# Patient Record
Sex: Female | Born: 1940 | Race: White | Hispanic: No
Health system: Southern US, Community
[De-identification: ages and names within clinical notes are randomized; demographics above are authoritative.]

---

## 2021-10-17 ENCOUNTER — Other Ambulatory Visit (HOSPITAL_BASED_OUTPATIENT_CLINIC_OR_DEPARTMENT_OTHER): Payer: Self-pay

## 2021-10-18 ENCOUNTER — Other Ambulatory Visit (HOSPITAL_BASED_OUTPATIENT_CLINIC_OR_DEPARTMENT_OTHER): Payer: Self-pay

## 2021-10-18 MED ORDER — LEVOTHYROXINE SODIUM 75 MCG PO TABS
ORAL_TABLET | ORAL | 1 refills | Status: DC
Start: 2021-10-18 — End: 2021-12-26
  Filled 2021-10-18: qty 30, 30d supply, fill #0
  Filled 2021-11-25: qty 30, 30d supply, fill #1

## 2021-10-18 MED ORDER — SERTRALINE HCL 100 MG PO TABS
ORAL_TABLET | ORAL | 1 refills | Status: AC
  Filled 2021-10-18: qty 30, 30d supply, fill #0
  Filled 2021-11-25: qty 30, 30d supply, fill #1

## 2021-10-30 ENCOUNTER — Other Ambulatory Visit (HOSPITAL_BASED_OUTPATIENT_CLINIC_OR_DEPARTMENT_OTHER): Payer: Self-pay

## 2021-10-30 MED ORDER — FAMOTIDINE 40 MG PO TABS
ORAL_TABLET | ORAL | 2 refills | Status: AC
  Filled 2021-10-30: qty 55, 55d supply, fill #0
  Filled 2021-10-31: qty 35, 35d supply, fill #0
  Filled 2022-01-22: qty 90, 90d supply, fill #1

## 2021-10-31 ENCOUNTER — Other Ambulatory Visit: Payer: Self-pay

## 2021-10-31 ENCOUNTER — Other Ambulatory Visit (HOSPITAL_BASED_OUTPATIENT_CLINIC_OR_DEPARTMENT_OTHER): Payer: Self-pay

## 2021-10-31 DIAGNOSIS — Z720 Tobacco use: Secondary | ICD-10-CM

## 2021-11-25 ENCOUNTER — Other Ambulatory Visit (HOSPITAL_BASED_OUTPATIENT_CLINIC_OR_DEPARTMENT_OTHER): Payer: Self-pay

## 2021-11-25 ENCOUNTER — Other Ambulatory Visit (HOSPITAL_COMMUNITY): Payer: Self-pay

## 2021-11-26 ENCOUNTER — Other Ambulatory Visit (HOSPITAL_BASED_OUTPATIENT_CLINIC_OR_DEPARTMENT_OTHER): Payer: Self-pay

## 2021-11-28 ENCOUNTER — Ambulatory Visit
Admission: RE | Admit: 2021-11-28 | Discharge: 2021-11-28 | Disposition: A | Discharge status: Home / Self Care | Payer: Medicare Other | Source: Ambulatory Visit

## 2021-11-28 ENCOUNTER — Other Ambulatory Visit: Payer: Self-pay

## 2021-11-28 DIAGNOSIS — Z720 Tobacco use: Secondary | ICD-10-CM

## 2021-12-03 ENCOUNTER — Other Ambulatory Visit: Payer: Self-pay | Admitting: *Deleted

## 2021-12-03 ENCOUNTER — Inpatient Hospital Stay: Payer: PPO

## 2021-12-03 ENCOUNTER — Inpatient Hospital Stay: Payer: PPO | Attending: Oncology

## 2021-12-03 DIAGNOSIS — D709 Neutropenia, unspecified: Secondary | ICD-10-CM

## 2021-12-03 DIAGNOSIS — E039 Hypothyroidism, unspecified: Secondary | ICD-10-CM | POA: Insufficient documentation

## 2021-12-03 DIAGNOSIS — D72819 Decreased white blood cell count, unspecified: Secondary | ICD-10-CM | POA: Diagnosis not present

## 2021-12-03 DIAGNOSIS — D61818 Other pancytopenia: Secondary | ICD-10-CM | POA: Diagnosis not present

## 2021-12-03 DIAGNOSIS — D696 Thrombocytopenia, unspecified: Secondary | ICD-10-CM | POA: Diagnosis not present

## 2021-12-03 LAB — SAVE SMEAR(SSMR), FOR PROVIDER SLIDE REVIEW

## 2021-12-03 LAB — CBC WITH DIFFERENTIAL (CANCER CENTER ONLY)
Abs Immature Granulocytes: 0.01 10*3/uL (ref 0.00–0.07)
Basophils Absolute: 0 10*3/uL (ref 0.0–0.1)
Basophils Relative: 0 %
Eosinophils Absolute: 0.1 10*3/uL (ref 0.0–0.5)
Eosinophils Relative: 3 %
HCT: 40 % (ref 36.0–46.0)
Hemoglobin: 13.2 g/dL (ref 12.0–15.0)
Immature Granulocytes: 0 %
Lymphocytes Relative: 32 %
Lymphs Abs: 1.1 10*3/uL (ref 0.7–4.0)
MCH: 33 pg (ref 26.0–34.0)
MCHC: 33 g/dL (ref 30.0–36.0)
MCV: 100 fL (ref 80.0–100.0)
Monocytes Absolute: 0.2 10*3/uL (ref 0.1–1.0)
Monocytes Relative: 6 %
Neutro Abs: 2 10*3/uL (ref 1.7–7.7)
Neutrophils Relative %: 59 %
Platelet Count: 97 10*3/uL — ABNORMAL LOW (ref 150–400)
RBC: 4 MIL/uL (ref 3.87–5.11)
RDW: 13.9 % (ref 11.5–15.5)
WBC Count: 3.4 10*3/uL — ABNORMAL LOW (ref 4.0–10.5)
nRBC: 0 % (ref 0.0–0.2)

## 2021-12-03 NOTE — Progress Notes (Signed)
New Hematology/Oncology Consult ? ? ?Requesting MD: Linus Galas, NP ? ?[phone] ? ?   ? ?Reason for Consult: Pancytopenia ? ?HPI: Brandy Rosario is an 81 year old woman referred for evaluation of pancytopenia.  She had an annual wellness visit on 10/30/2021.  Labs from 10/17/2021 showed hemoglobin 13.2, white blood cell count 2.7, ANC 1.5, platelet count 95,000; unremarkable chemistry panel.  CBC done 08/01/2021: Hemoglobin 13.9, white blood cell count 3.9, ANC 2.3, platelet count 104,000.   ? ?She had a chest CT 11/28/2021 to evaluate increased shortness of breath, cough.  There were no acute findings.  Liver margin noted to be slightly irregular indicative of cirrhosis. ? ?She relocated to this area from [place] about 6 months ago.  She reports being told in the past platelets were low and "nothing to worry about". ? ?Past medical history: ?Hypothyroid ?Depression ?"Reflux" ? ?History reviewed. No pertinent surgical history.: ? ? ?Current Outpatient Medications:  ?  famotidine (PEPCID) 40 MG tablet, Take 1 tablet by mouth at bedtime, Disp: 90 tablet, Rfl: 2 ?  levothyroxine (SYNTHROID) 75 MCG tablet, Take 1 tablet by mouth once daily in the morning on an empty stomach., Disp: 30 tablet, Rfl: 1 ?  sertraline (ZOLOFT) 100 MG tablet, Take 1 tablet by mouth once daily., Disp: 30 tablet, Rfl: 1: ? ? ?Allergies:  No known drug allergies ? ?FH: Mother deceased with heart issues.  Father deceased with lung cancer.  Sister recently deceased, possibly pancreas cancer. ? ?SOCIAL HISTORY: She relocated to this area from [place] about 6 months ago after her husband died.  She was initially living with her son and daughter-in-law.  She recently moved into independent senior living.  She is retired.  Previous bookkeeping.  She estimates smoking for the past 50 to 60 years, currently less than 1 pack/day.  She denies alcohol use. ? ?Review of Systems: No fevers.  She has occasional sweats at nighttime.  No recurrent infections.   No bleeding.  She notes easy bruising over the forearms.  No recent weight loss.  She did lose her appetite after her husband's death in 08/18/21.  Appetite is now described as good.  She has an occasional headache.  No visual disturbance.  She has stable dyspnea on exertion.  Chronic cough.  No change in bowel habits.  No urinary symptoms.  No numbness or tingling in the hands or feet. ? ?Physical Exam: ? ?Blood pressure 138/73, pulse 78, temperature 98.1 ?F (36.7 ?C), temperature source Oral, resp. rate 18, height 5\' 1"  (1.549 m), weight 114 lb 3.2 oz (51.8 kg), SpO2 96 %. ? ?HEENT: No thrush or ulcers. ?Lungs: Distant breath sounds. ?Cardiac: Regular rate and rhythm.  2/6 systolic murmur. ?Abdomen: Abdomen is soft and nontender.  No hepatosplenomegaly. ?Vascular: Trace bilateral ankle edema. ?Lymph nodes: No palpable cervical, supraclavicular, axillary or inguinal lymph nodes. ?Neurologic: Alert and oriented.  Intermittent resting hand tremor bilaterally. ?Skin: Ecchymoses scattered over the dorsal aspect of both hands and forearms.  Brown discoloration at the lower legs. ?Musculoskeletal: Back is kyphotic. ? ?LABS: ? ? ?Recent Labs  ?  12/03/21 ?1408  ?WBC 3.4*  ?HGB 13.2  ?HCT 40.0  ?PLT 97*  ?Peripheral blood smear-White cell morphology is unremarkable; platelets appear mildly decreased, no platelet clumps; moderate ovalocytes, microcytes, micro spherocytes, rare teardrop, no nucleated red blood cells, polychromasia not increased. ? ?RADIOLOGY: ? ?CT CHEST WO CONTRAST ? ?Result Date: 12/02/2021 ?CLINICAL DATA:  Increased shortness of breath, cough, smoker. EXAM: CT CHEST WITHOUT CONTRAST  TECHNIQUE: Multidetector CT imaging of the chest was performed following the standard protocol without IV contrast. RADIATION DOSE REDUCTION: This exam was performed according to the departmental dose-optimization program which includes automated exposure control, adjustment of the mA and/or kV according to patient size  and/or use of iterative reconstruction technique. COMPARISON:  None. FINDINGS: Cardiovascular: Atherosclerotic calcification of the aorta, aortic valve and coronary arteries. Calcified ductus diverticulum off the inferior aortic arch. Heart is enlarged. No pericardial effusion. Mediastinum/Nodes: No pathologically enlarged mediastinal or axillary lymph nodes. Hilar regions are difficult to definitively evaluate without IV contrast. Esophagus is grossly unremarkable. Lungs/Pleura: Mild biapical pleuroparenchymal scarring. Smoking related respiratory bronchiolitis. Image quality is somewhat degraded by respiratory motion. Scattered subsegmental volume loss. No airspace consolidation or pleural fluid. Calcified left lower lobe granuloma. No pleural fluid. Airway is unremarkable. Upper Abdomen: Liver margin is slightly irregular. Visualized portions of the liver, adrenal glands, kidneys, spleen and stomach are otherwise unremarkable. Musculoskeletal: Degenerative changes in the spine. No worrisome lytic or sclerotic lesions. IMPRESSION: 1. No acute findings to explain the patient's clinical history. 2. Liver margin is slightly irregular, indicative of cirrhosis. 3. Aortic atherosclerosis (ICD10-I70.0). Coronary artery calcification. Electronically Signed   By: Leanna Battles M.D.   On: 12/02/2021 11:52   ? ?Assessment and Plan:  ? ?Mild leukopenia and mild thrombocytopenia ?Possible cirrhosis on CT chest 11/28/2021 ?Current tobacco use ?Hypothyroid ?Depression ?"Reflux" ? ?Brandy Rosario was referred for evaluation of mild leukopenia and mild thrombocytopenia.  The differential diagnosis includes cirrhosis, myelodysplasia, vitamin deficiency, lymphoproliferative disorder.  We are obtaining additional laboratory evaluation today.  We are requesting prior CBCs from her PCP in [place]. ? ?She was noted to possibly have cirrhosis on a CT scan from 11/28/2021.  We will defer further evaluation of this to her PCP. ? ?She will  return for a CBC and follow-up visit in approximately 3 months.  We are available to see her sooner if needed. ? ?Patient seen with Dr. Truett Perna. ? ?Lonna Cobb, NP ?12/04/2021, 12:12 PM  ? ?This was a shared visit with Lonna Cobb.  Brandy Rosario was interviewed and examined.  I reviewed the peripheral blood smear.  She is referred for evaluation of leukopenia and thrombocytopenia. ?The differential diagnosis includes pancytopenia related to cirrhosis, though she does not have an established diagnosis of cirrhosis.  There was evidence of cirrhosis on a CT last month. ? ?The differential diagnosis clues myelodysplasia and less likely a hematopoietic malignancy.  We will add additional hematologic testing to the labs ordered yesterday. ? ?I was present for greater than 50% today's visit.  I performed medical decision making. ? ?Mancel Bale, MD ?

## 2021-12-03 NOTE — Progress Notes (Signed)
Lab orders entered for New patient appt 

## 2021-12-04 ENCOUNTER — Inpatient Hospital Stay: Payer: PPO | Admitting: Nurse Practitioner

## 2021-12-04 ENCOUNTER — Encounter: Payer: Self-pay | Admitting: Nurse Practitioner

## 2021-12-04 ENCOUNTER — Other Ambulatory Visit (HOSPITAL_BASED_OUTPATIENT_CLINIC_OR_DEPARTMENT_OTHER): Payer: Self-pay

## 2021-12-04 VITALS — BP 138/73 | HR 78 | Temp 98.1°F | Resp 18 | Ht 61.0 in | Wt 114.2 lb

## 2021-12-04 DIAGNOSIS — D709 Neutropenia, unspecified: Secondary | ICD-10-CM

## 2021-12-04 DIAGNOSIS — D696 Thrombocytopenia, unspecified: Secondary | ICD-10-CM

## 2021-12-04 LAB — RETIC PANEL
Immature Retic Fract: 4.7 % (ref 2.3–15.9)
RBC.: 4.08 MIL/uL (ref 3.87–5.11)
Retic Count, Absolute: 55.5 10*3/uL (ref 19.0–186.0)
Retic Ct Pct: 1.4 % (ref 0.4–3.1)
Reticulocyte Hemoglobin: 36.1 pg (ref >27.9)

## 2021-12-04 LAB — VITAMIN B12: Vitamin B-12: 216 pg/mL (ref 180–914)

## 2021-12-04 LAB — FERRITIN: Ferritin: 58 ng/mL (ref 11–307)

## 2021-12-09 LAB — MULTIPLE MYELOMA PANEL, SERUM
Albumin SerPl Elph-Mcnc: 4 g/dL (ref 2.9–4.4)
Albumin/Glob SerPl: 1.7 (ref 0.7–1.7)
Alpha 1: 0.2 g/dL (ref 0.0–0.4)
Alpha2 Glob SerPl Elph-Mcnc: 0.5 g/dL (ref 0.4–1.0)
B-Globulin SerPl Elph-Mcnc: 0.7 g/dL (ref 0.7–1.3)
Gamma Glob SerPl Elph-Mcnc: 1.1 g/dL (ref 0.4–1.8)
Globulin, Total: 2.5 g/dL (ref 2.2–3.9)
IgA: 163 mg/dL (ref 64–422)
IgG (Immunoglobin G), Serum: 962 mg/dL (ref 586–1602)
IgM (Immunoglobulin M), Srm: 315 mg/dL — ABNORMAL HIGH (ref 26–217)
Total Protein ELP: 6.5 g/dL (ref 6.0–8.5)

## 2021-12-25 ENCOUNTER — Other Ambulatory Visit (HOSPITAL_BASED_OUTPATIENT_CLINIC_OR_DEPARTMENT_OTHER): Payer: Self-pay

## 2021-12-26 ENCOUNTER — Other Ambulatory Visit (HOSPITAL_BASED_OUTPATIENT_CLINIC_OR_DEPARTMENT_OTHER): Payer: Self-pay

## 2021-12-26 MED ORDER — LEVOTHYROXINE SODIUM 75 MCG PO TABS
75.0000 ug | ORAL_TABLET | Freq: Every day | ORAL | 2 refills | Status: AC
  Filled 2021-12-26: qty 30, 30d supply, fill #0
  Filled 2022-01-22: qty 30, 30d supply, fill #1

## 2021-12-27 ENCOUNTER — Other Ambulatory Visit (HOSPITAL_BASED_OUTPATIENT_CLINIC_OR_DEPARTMENT_OTHER): Payer: Self-pay

## 2022-01-22 ENCOUNTER — Other Ambulatory Visit (HOSPITAL_BASED_OUTPATIENT_CLINIC_OR_DEPARTMENT_OTHER): Payer: Self-pay

## 2022-01-30 DIAGNOSIS — F17200 Nicotine dependence, unspecified, uncomplicated: Secondary | ICD-10-CM | POA: Diagnosis not present

## 2022-01-30 DIAGNOSIS — E039 Hypothyroidism, unspecified: Secondary | ICD-10-CM | POA: Diagnosis not present

## 2022-01-30 DIAGNOSIS — K219 Gastro-esophageal reflux disease without esophagitis: Secondary | ICD-10-CM | POA: Diagnosis not present

## 2022-01-30 DIAGNOSIS — D61818 Other pancytopenia: Secondary | ICD-10-CM | POA: Diagnosis not present

## 2022-01-30 DIAGNOSIS — F418 Other specified anxiety disorders: Secondary | ICD-10-CM | POA: Diagnosis not present

## 2022-01-30 DIAGNOSIS — K7469 Other cirrhosis of liver: Secondary | ICD-10-CM | POA: Diagnosis not present

## 2022-01-30 DIAGNOSIS — Z8639 Personal history of other endocrine, nutritional and metabolic disease: Secondary | ICD-10-CM | POA: Diagnosis not present

## 2022-03-06 ENCOUNTER — Inpatient Hospital Stay: Payer: PPO

## 2022-03-06 ENCOUNTER — Inpatient Hospital Stay: Payer: PPO | Admitting: Oncology

## 2022-08-06 DIAGNOSIS — K7469 Other cirrhosis of liver: Secondary | ICD-10-CM | POA: Diagnosis not present

## 2022-08-06 DIAGNOSIS — Z8639 Personal history of other endocrine, nutritional and metabolic disease: Secondary | ICD-10-CM | POA: Diagnosis not present

## 2022-08-06 DIAGNOSIS — F418 Other specified anxiety disorders: Secondary | ICD-10-CM | POA: Diagnosis not present

## 2022-08-06 DIAGNOSIS — E039 Hypothyroidism, unspecified: Secondary | ICD-10-CM | POA: Diagnosis not present

## 2022-08-06 DIAGNOSIS — D61818 Other pancytopenia: Secondary | ICD-10-CM | POA: Diagnosis not present

## 2022-08-14 DIAGNOSIS — R296 Repeated falls: Secondary | ICD-10-CM | POA: Diagnosis not present

## 2022-08-14 DIAGNOSIS — I998 Other disorder of circulatory system: Secondary | ICD-10-CM | POA: Diagnosis not present

## 2022-08-14 DIAGNOSIS — E039 Hypothyroidism, unspecified: Secondary | ICD-10-CM | POA: Diagnosis not present

## 2022-08-14 DIAGNOSIS — R410 Disorientation, unspecified: Secondary | ICD-10-CM | POA: Diagnosis not present

## 2022-08-14 DIAGNOSIS — D61818 Other pancytopenia: Secondary | ICD-10-CM | POA: Diagnosis not present

## 2022-08-14 DIAGNOSIS — Z720 Tobacco use: Secondary | ICD-10-CM | POA: Diagnosis not present

## 2022-08-14 DIAGNOSIS — K7469 Other cirrhosis of liver: Secondary | ICD-10-CM | POA: Diagnosis not present

## 2022-08-14 DIAGNOSIS — F418 Other specified anxiety disorders: Secondary | ICD-10-CM | POA: Diagnosis not present

## 2022-08-14 DIAGNOSIS — K219 Gastro-esophageal reflux disease without esophagitis: Secondary | ICD-10-CM | POA: Diagnosis not present

## 2022-08-14 DIAGNOSIS — R011 Cardiac murmur, unspecified: Secondary | ICD-10-CM | POA: Diagnosis not present

## 2022-09-03 DIAGNOSIS — Z720 Tobacco use: Secondary | ICD-10-CM | POA: Diagnosis not present

## 2022-09-03 DIAGNOSIS — Z9181 History of falling: Secondary | ICD-10-CM | POA: Diagnosis not present

## 2022-09-03 DIAGNOSIS — F418 Other specified anxiety disorders: Secondary | ICD-10-CM | POA: Diagnosis not present

## 2022-09-03 DIAGNOSIS — R296 Repeated falls: Secondary | ICD-10-CM | POA: Diagnosis not present

## 2022-09-03 DIAGNOSIS — D61818 Other pancytopenia: Secondary | ICD-10-CM | POA: Diagnosis not present

## 2022-09-03 DIAGNOSIS — I998 Other disorder of circulatory system: Secondary | ICD-10-CM | POA: Diagnosis not present

## 2022-09-03 DIAGNOSIS — R410 Disorientation, unspecified: Secondary | ICD-10-CM | POA: Diagnosis not present

## 2022-09-03 DIAGNOSIS — K219 Gastro-esophageal reflux disease without esophagitis: Secondary | ICD-10-CM | POA: Diagnosis not present

## 2022-09-03 DIAGNOSIS — Z556 Problems related to health literacy: Secondary | ICD-10-CM | POA: Diagnosis not present

## 2022-09-03 DIAGNOSIS — K7469 Other cirrhosis of liver: Secondary | ICD-10-CM | POA: Diagnosis not present

## 2022-09-03 DIAGNOSIS — E039 Hypothyroidism, unspecified: Secondary | ICD-10-CM | POA: Diagnosis not present

## 2022-09-08 DIAGNOSIS — K219 Gastro-esophageal reflux disease without esophagitis: Secondary | ICD-10-CM | POA: Diagnosis not present

## 2022-09-08 DIAGNOSIS — E039 Hypothyroidism, unspecified: Secondary | ICD-10-CM | POA: Diagnosis not present

## 2022-09-08 DIAGNOSIS — R296 Repeated falls: Secondary | ICD-10-CM | POA: Diagnosis not present

## 2022-09-08 DIAGNOSIS — Z556 Problems related to health literacy: Secondary | ICD-10-CM | POA: Diagnosis not present

## 2022-09-08 DIAGNOSIS — Z720 Tobacco use: Secondary | ICD-10-CM | POA: Diagnosis not present

## 2022-09-08 DIAGNOSIS — I998 Other disorder of circulatory system: Secondary | ICD-10-CM | POA: Diagnosis not present

## 2022-09-08 DIAGNOSIS — D61818 Other pancytopenia: Secondary | ICD-10-CM | POA: Diagnosis not present

## 2022-09-08 DIAGNOSIS — K7469 Other cirrhosis of liver: Secondary | ICD-10-CM | POA: Diagnosis not present

## 2022-09-08 DIAGNOSIS — Z9181 History of falling: Secondary | ICD-10-CM | POA: Diagnosis not present

## 2022-09-08 DIAGNOSIS — R410 Disorientation, unspecified: Secondary | ICD-10-CM | POA: Diagnosis not present

## 2022-09-08 DIAGNOSIS — F418 Other specified anxiety disorders: Secondary | ICD-10-CM | POA: Diagnosis not present

## 2022-09-09 DIAGNOSIS — F418 Other specified anxiety disorders: Secondary | ICD-10-CM | POA: Diagnosis not present

## 2022-09-09 DIAGNOSIS — D61818 Other pancytopenia: Secondary | ICD-10-CM | POA: Diagnosis not present

## 2022-09-09 DIAGNOSIS — K219 Gastro-esophageal reflux disease without esophagitis: Secondary | ICD-10-CM | POA: Diagnosis not present

## 2022-09-09 DIAGNOSIS — E039 Hypothyroidism, unspecified: Secondary | ICD-10-CM | POA: Diagnosis not present

## 2022-09-16 DIAGNOSIS — F418 Other specified anxiety disorders: Secondary | ICD-10-CM | POA: Diagnosis not present

## 2022-09-16 DIAGNOSIS — I998 Other disorder of circulatory system: Secondary | ICD-10-CM | POA: Diagnosis not present

## 2022-09-16 DIAGNOSIS — K219 Gastro-esophageal reflux disease without esophagitis: Secondary | ICD-10-CM | POA: Diagnosis not present

## 2022-09-16 DIAGNOSIS — Z9181 History of falling: Secondary | ICD-10-CM | POA: Diagnosis not present

## 2022-09-16 DIAGNOSIS — E039 Hypothyroidism, unspecified: Secondary | ICD-10-CM | POA: Diagnosis not present

## 2022-09-16 DIAGNOSIS — R410 Disorientation, unspecified: Secondary | ICD-10-CM | POA: Diagnosis not present

## 2022-09-16 DIAGNOSIS — D61818 Other pancytopenia: Secondary | ICD-10-CM | POA: Diagnosis not present

## 2022-09-16 DIAGNOSIS — R296 Repeated falls: Secondary | ICD-10-CM | POA: Diagnosis not present

## 2022-09-16 DIAGNOSIS — K7469 Other cirrhosis of liver: Secondary | ICD-10-CM | POA: Diagnosis not present

## 2022-09-16 DIAGNOSIS — Z556 Problems related to health literacy: Secondary | ICD-10-CM | POA: Diagnosis not present

## 2022-09-16 DIAGNOSIS — Z720 Tobacco use: Secondary | ICD-10-CM | POA: Diagnosis not present

## 2022-09-18 DIAGNOSIS — Z9181 History of falling: Secondary | ICD-10-CM | POA: Diagnosis not present

## 2022-09-18 DIAGNOSIS — D61818 Other pancytopenia: Secondary | ICD-10-CM | POA: Diagnosis not present

## 2022-09-18 DIAGNOSIS — Z556 Problems related to health literacy: Secondary | ICD-10-CM | POA: Diagnosis not present

## 2022-09-18 DIAGNOSIS — F418 Other specified anxiety disorders: Secondary | ICD-10-CM | POA: Diagnosis not present

## 2022-09-18 DIAGNOSIS — R296 Repeated falls: Secondary | ICD-10-CM | POA: Diagnosis not present

## 2022-09-18 DIAGNOSIS — K219 Gastro-esophageal reflux disease without esophagitis: Secondary | ICD-10-CM | POA: Diagnosis not present

## 2022-09-18 DIAGNOSIS — R410 Disorientation, unspecified: Secondary | ICD-10-CM | POA: Diagnosis not present

## 2022-09-18 DIAGNOSIS — Z720 Tobacco use: Secondary | ICD-10-CM | POA: Diagnosis not present

## 2022-09-18 DIAGNOSIS — I998 Other disorder of circulatory system: Secondary | ICD-10-CM | POA: Diagnosis not present

## 2022-09-18 DIAGNOSIS — K7469 Other cirrhosis of liver: Secondary | ICD-10-CM | POA: Diagnosis not present

## 2022-09-18 DIAGNOSIS — E039 Hypothyroidism, unspecified: Secondary | ICD-10-CM | POA: Diagnosis not present

## 2022-09-19 DIAGNOSIS — D61818 Other pancytopenia: Secondary | ICD-10-CM | POA: Diagnosis not present

## 2022-09-19 DIAGNOSIS — R410 Disorientation, unspecified: Secondary | ICD-10-CM | POA: Diagnosis not present

## 2022-09-19 DIAGNOSIS — K7469 Other cirrhosis of liver: Secondary | ICD-10-CM | POA: Diagnosis not present

## 2022-09-19 DIAGNOSIS — Z720 Tobacco use: Secondary | ICD-10-CM | POA: Diagnosis not present

## 2022-09-19 DIAGNOSIS — Z556 Problems related to health literacy: Secondary | ICD-10-CM | POA: Diagnosis not present

## 2022-09-19 DIAGNOSIS — F418 Other specified anxiety disorders: Secondary | ICD-10-CM | POA: Diagnosis not present

## 2022-09-19 DIAGNOSIS — I998 Other disorder of circulatory system: Secondary | ICD-10-CM | POA: Diagnosis not present

## 2022-09-19 DIAGNOSIS — E039 Hypothyroidism, unspecified: Secondary | ICD-10-CM | POA: Diagnosis not present

## 2022-09-19 DIAGNOSIS — R296 Repeated falls: Secondary | ICD-10-CM | POA: Diagnosis not present

## 2022-09-19 DIAGNOSIS — K219 Gastro-esophageal reflux disease without esophagitis: Secondary | ICD-10-CM | POA: Diagnosis not present

## 2022-09-19 DIAGNOSIS — Z9181 History of falling: Secondary | ICD-10-CM | POA: Diagnosis not present

## 2022-10-02 DIAGNOSIS — Z9181 History of falling: Secondary | ICD-10-CM | POA: Diagnosis not present

## 2022-10-02 DIAGNOSIS — D61818 Other pancytopenia: Secondary | ICD-10-CM | POA: Diagnosis not present

## 2022-10-02 DIAGNOSIS — I998 Other disorder of circulatory system: Secondary | ICD-10-CM | POA: Diagnosis not present

## 2022-10-02 DIAGNOSIS — R296 Repeated falls: Secondary | ICD-10-CM | POA: Diagnosis not present

## 2022-10-02 DIAGNOSIS — R410 Disorientation, unspecified: Secondary | ICD-10-CM | POA: Diagnosis not present

## 2022-10-02 DIAGNOSIS — M6281 Muscle weakness (generalized): Secondary | ICD-10-CM | POA: Diagnosis not present

## 2022-10-06 DIAGNOSIS — K7469 Other cirrhosis of liver: Secondary | ICD-10-CM | POA: Diagnosis not present

## 2022-10-06 DIAGNOSIS — I998 Other disorder of circulatory system: Secondary | ICD-10-CM | POA: Diagnosis not present

## 2022-10-06 DIAGNOSIS — E039 Hypothyroidism, unspecified: Secondary | ICD-10-CM | POA: Diagnosis not present

## 2022-10-06 DIAGNOSIS — R410 Disorientation, unspecified: Secondary | ICD-10-CM | POA: Diagnosis not present

## 2022-10-06 DIAGNOSIS — F418 Other specified anxiety disorders: Secondary | ICD-10-CM | POA: Diagnosis not present

## 2022-10-06 DIAGNOSIS — Z720 Tobacco use: Secondary | ICD-10-CM | POA: Diagnosis not present

## 2022-10-06 DIAGNOSIS — K219 Gastro-esophageal reflux disease without esophagitis: Secondary | ICD-10-CM | POA: Diagnosis not present

## 2022-10-06 DIAGNOSIS — Z9181 History of falling: Secondary | ICD-10-CM | POA: Diagnosis not present

## 2022-10-06 DIAGNOSIS — D61818 Other pancytopenia: Secondary | ICD-10-CM | POA: Diagnosis not present

## 2022-10-06 DIAGNOSIS — Z556 Problems related to health literacy: Secondary | ICD-10-CM | POA: Diagnosis not present

## 2022-10-06 DIAGNOSIS — R296 Repeated falls: Secondary | ICD-10-CM | POA: Diagnosis not present

## 2022-10-08 DIAGNOSIS — K7469 Other cirrhosis of liver: Secondary | ICD-10-CM | POA: Diagnosis not present

## 2022-10-08 DIAGNOSIS — E039 Hypothyroidism, unspecified: Secondary | ICD-10-CM | POA: Diagnosis not present

## 2022-10-08 DIAGNOSIS — K219 Gastro-esophageal reflux disease without esophagitis: Secondary | ICD-10-CM | POA: Diagnosis not present

## 2022-10-08 DIAGNOSIS — I998 Other disorder of circulatory system: Secondary | ICD-10-CM | POA: Diagnosis not present

## 2022-10-08 DIAGNOSIS — Z9181 History of falling: Secondary | ICD-10-CM | POA: Diagnosis not present

## 2022-10-08 DIAGNOSIS — R296 Repeated falls: Secondary | ICD-10-CM | POA: Diagnosis not present

## 2022-10-08 DIAGNOSIS — Z556 Problems related to health literacy: Secondary | ICD-10-CM | POA: Diagnosis not present

## 2022-10-08 DIAGNOSIS — R410 Disorientation, unspecified: Secondary | ICD-10-CM | POA: Diagnosis not present

## 2022-10-08 DIAGNOSIS — Z720 Tobacco use: Secondary | ICD-10-CM | POA: Diagnosis not present

## 2022-10-08 DIAGNOSIS — D61818 Other pancytopenia: Secondary | ICD-10-CM | POA: Diagnosis not present

## 2022-10-08 DIAGNOSIS — F418 Other specified anxiety disorders: Secondary | ICD-10-CM | POA: Diagnosis not present

## 2022-10-10 DIAGNOSIS — R410 Disorientation, unspecified: Secondary | ICD-10-CM | POA: Diagnosis not present

## 2022-10-10 DIAGNOSIS — D61818 Other pancytopenia: Secondary | ICD-10-CM | POA: Diagnosis not present

## 2022-10-10 DIAGNOSIS — R296 Repeated falls: Secondary | ICD-10-CM | POA: Diagnosis not present

## 2022-10-10 DIAGNOSIS — E039 Hypothyroidism, unspecified: Secondary | ICD-10-CM | POA: Diagnosis not present

## 2022-10-10 DIAGNOSIS — Z9181 History of falling: Secondary | ICD-10-CM | POA: Diagnosis not present

## 2022-10-10 DIAGNOSIS — K7469 Other cirrhosis of liver: Secondary | ICD-10-CM | POA: Diagnosis not present

## 2022-10-10 DIAGNOSIS — Z556 Problems related to health literacy: Secondary | ICD-10-CM | POA: Diagnosis not present

## 2022-10-10 DIAGNOSIS — K219 Gastro-esophageal reflux disease without esophagitis: Secondary | ICD-10-CM | POA: Diagnosis not present

## 2022-10-10 DIAGNOSIS — Z720 Tobacco use: Secondary | ICD-10-CM | POA: Diagnosis not present

## 2022-10-10 DIAGNOSIS — F418 Other specified anxiety disorders: Secondary | ICD-10-CM | POA: Diagnosis not present

## 2022-10-10 DIAGNOSIS — I998 Other disorder of circulatory system: Secondary | ICD-10-CM | POA: Diagnosis not present

## 2022-10-28 ENCOUNTER — Other Ambulatory Visit (HOSPITAL_COMMUNITY): Payer: Self-pay

## 2022-10-28 DIAGNOSIS — Z720 Tobacco use: Secondary | ICD-10-CM

## 2022-11-15 ENCOUNTER — Ambulatory Visit (HOSPITAL_BASED_OUTPATIENT_CLINIC_OR_DEPARTMENT_OTHER): Payer: PPO

## 2022-11-29 ENCOUNTER — Ambulatory Visit (HOSPITAL_BASED_OUTPATIENT_CLINIC_OR_DEPARTMENT_OTHER)
Admission: RE | Admit: 2022-11-29 | Discharge: 2022-11-29 | Disposition: A | Discharge status: Home / Self Care | Payer: PPO | Source: Ambulatory Visit

## 2022-11-29 DIAGNOSIS — I700 Atherosclerosis of aorta: Secondary | ICD-10-CM | POA: Diagnosis not present

## 2022-11-29 DIAGNOSIS — Z720 Tobacco use: Secondary | ICD-10-CM

## 2023-02-03 DIAGNOSIS — R410 Disorientation, unspecified: Secondary | ICD-10-CM | POA: Diagnosis not present

## 2023-02-03 DIAGNOSIS — I998 Other disorder of circulatory system: Secondary | ICD-10-CM | POA: Diagnosis not present

## 2023-02-03 DIAGNOSIS — Z8639 Personal history of other endocrine, nutritional and metabolic disease: Secondary | ICD-10-CM | POA: Diagnosis not present

## 2023-02-03 DIAGNOSIS — E039 Hypothyroidism, unspecified: Secondary | ICD-10-CM | POA: Diagnosis not present

## 2023-02-03 DIAGNOSIS — K7469 Other cirrhosis of liver: Secondary | ICD-10-CM | POA: Diagnosis not present

## 2023-02-03 DIAGNOSIS — D61818 Other pancytopenia: Secondary | ICD-10-CM | POA: Diagnosis not present

## 2023-02-03 DIAGNOSIS — R011 Cardiac murmur, unspecified: Secondary | ICD-10-CM | POA: Diagnosis not present

## 2023-02-10 DIAGNOSIS — D61818 Other pancytopenia: Secondary | ICD-10-CM | POA: Diagnosis not present

## 2023-02-10 DIAGNOSIS — I998 Other disorder of circulatory system: Secondary | ICD-10-CM | POA: Diagnosis not present

## 2023-02-10 DIAGNOSIS — E039 Hypothyroidism, unspecified: Secondary | ICD-10-CM | POA: Diagnosis not present

## 2023-02-10 DIAGNOSIS — K7469 Other cirrhosis of liver: Secondary | ICD-10-CM | POA: Diagnosis not present

## 2023-02-10 DIAGNOSIS — R011 Cardiac murmur, unspecified: Secondary | ICD-10-CM | POA: Diagnosis not present

## 2023-02-10 DIAGNOSIS — F418 Other specified anxiety disorders: Secondary | ICD-10-CM | POA: Diagnosis not present

## 2023-02-10 DIAGNOSIS — Z720 Tobacco use: Secondary | ICD-10-CM | POA: Diagnosis not present

## 2023-02-10 DIAGNOSIS — Z0000 Encounter for general adult medical examination without abnormal findings: Secondary | ICD-10-CM | POA: Diagnosis not present

## 2023-02-10 DIAGNOSIS — R413 Other amnesia: Secondary | ICD-10-CM | POA: Diagnosis not present

## 2023-02-10 DIAGNOSIS — K219 Gastro-esophageal reflux disease without esophagitis: Secondary | ICD-10-CM | POA: Diagnosis not present

## 2023-02-20 ENCOUNTER — Other Ambulatory Visit (HOSPITAL_BASED_OUTPATIENT_CLINIC_OR_DEPARTMENT_OTHER): Payer: Self-pay

## 2023-05-04 IMAGING — CT CT CHEST W/O CM
1 of 2 series · 15 of 32 positions shown, 19 images · non-contrast
Comparison: None.

CLINICAL DATA: Increased shortness of breath, cough, smoker.



[Series 5: super d · axial · 0.54mm/px · z∈[-248,-1]mm · 15 of 346 slices shown, 19 images]
[im 19/346  mediastinal]
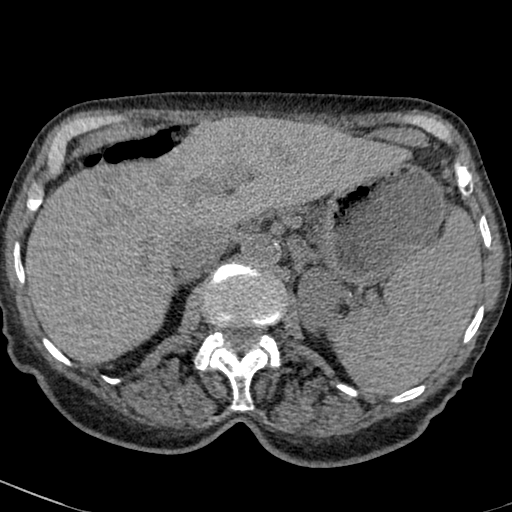
[im 19/346  lung]
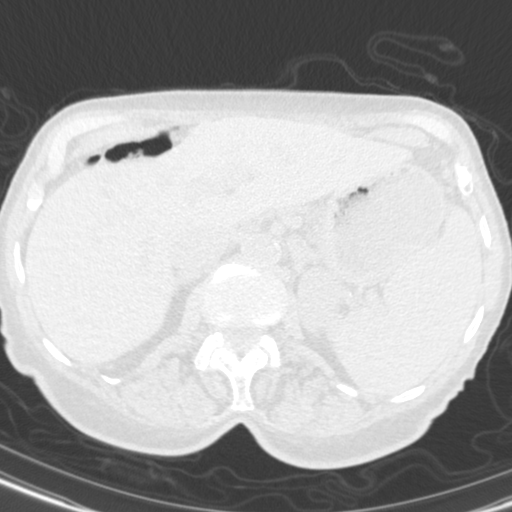
[im 55/346  lung]
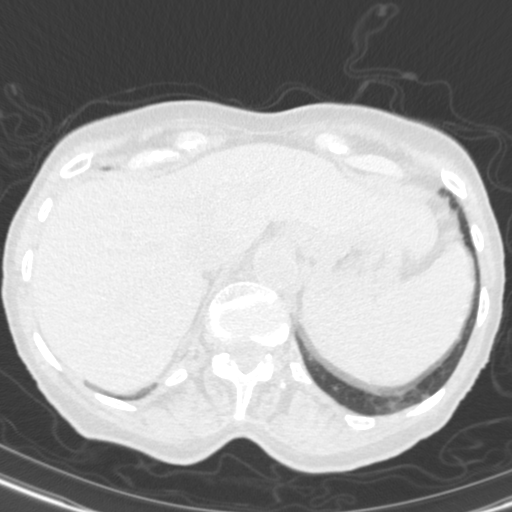
[im 73/346  lung]
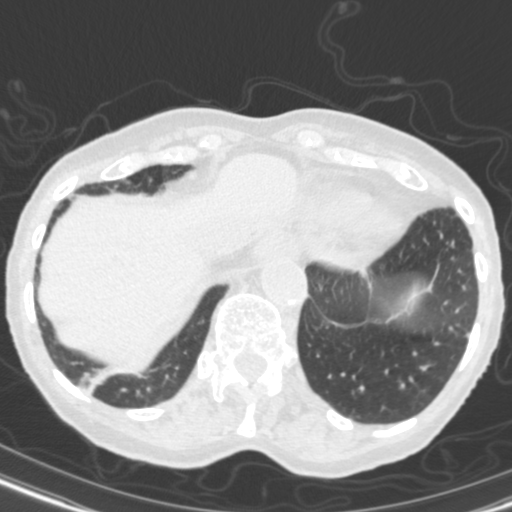
[im 91/346  lung]
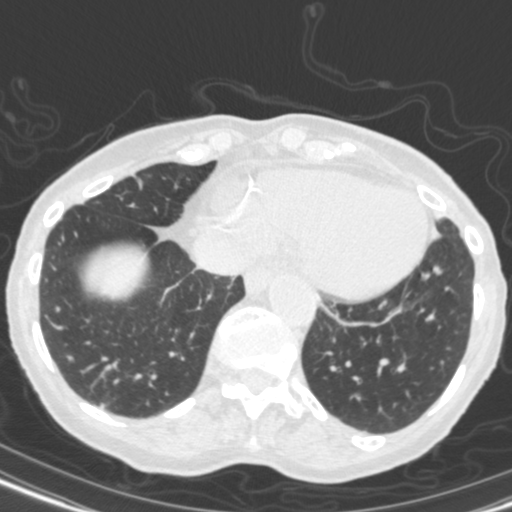
[im 116/346  mediastinal]
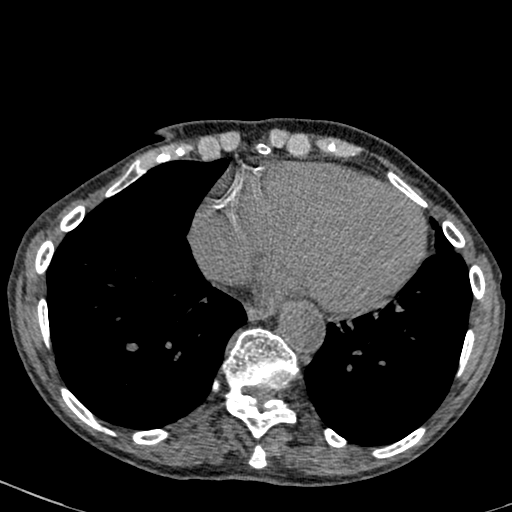
[im 116/346  lung]
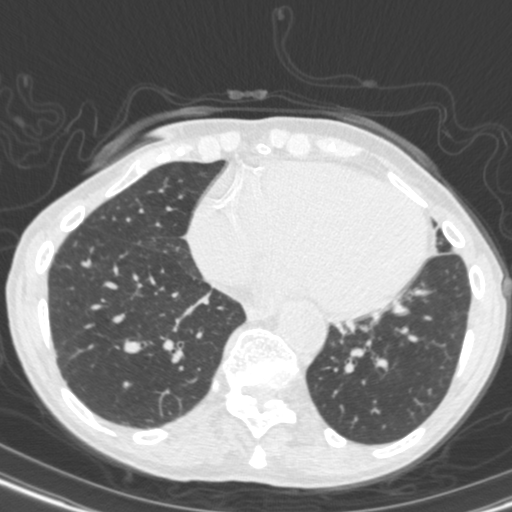
[im 128/346  lung]
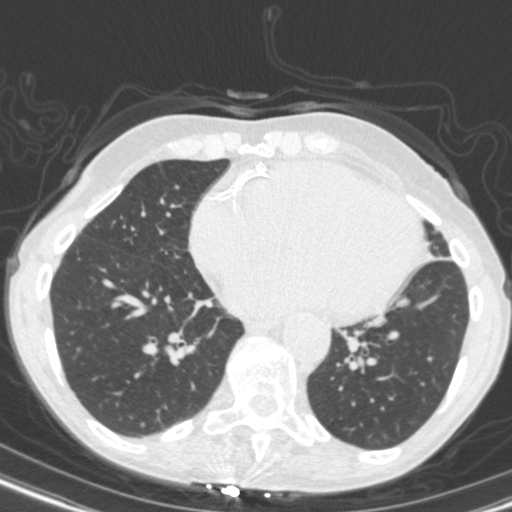
[im 161/346  lung]
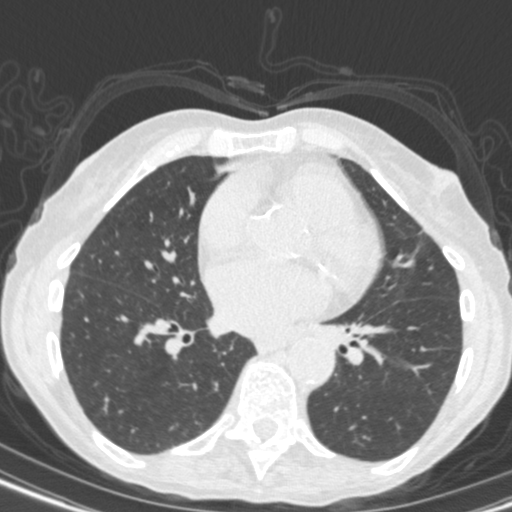
[im 164/346  lung]
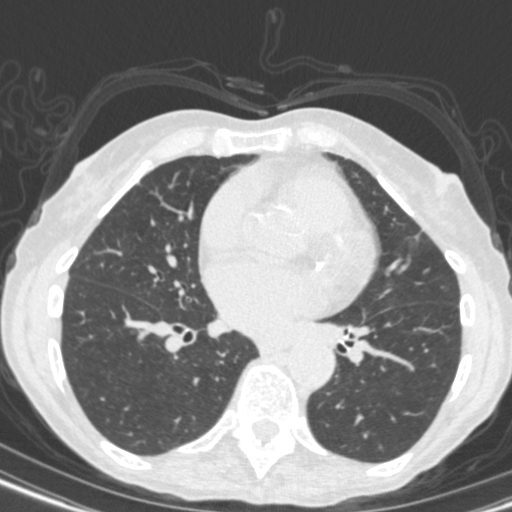
[im 182/346  mediastinal]
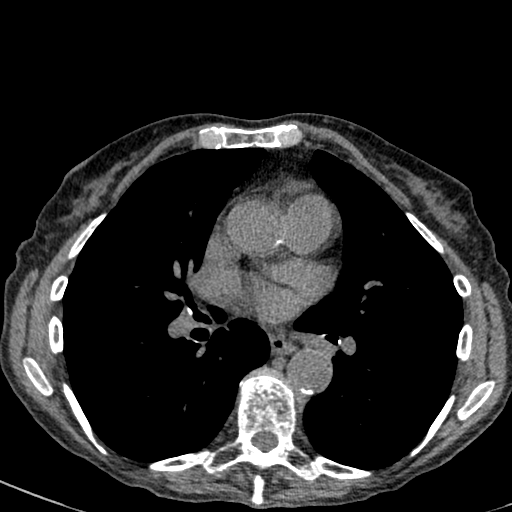
[im 182/346  lung]
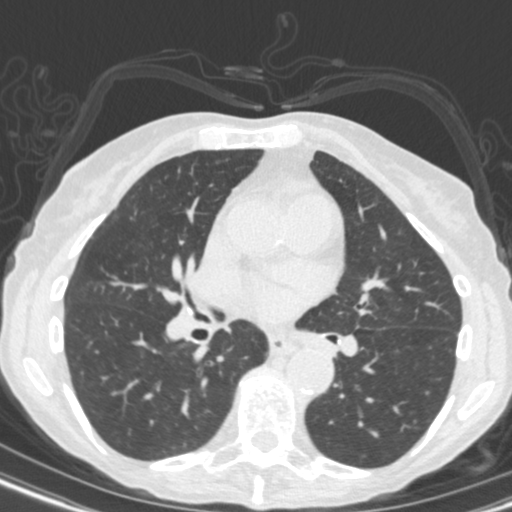
[im 218/346  lung]
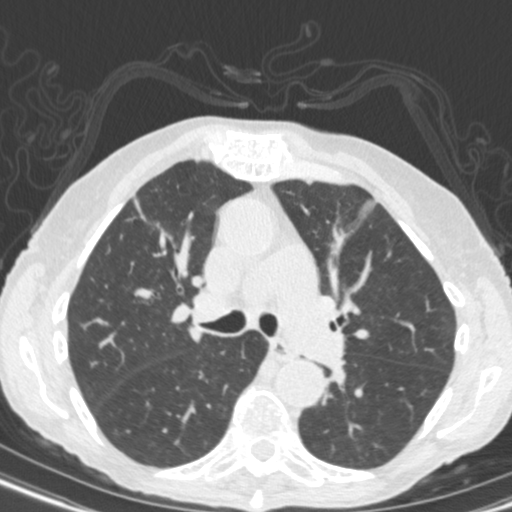
[im 231/346  lung]
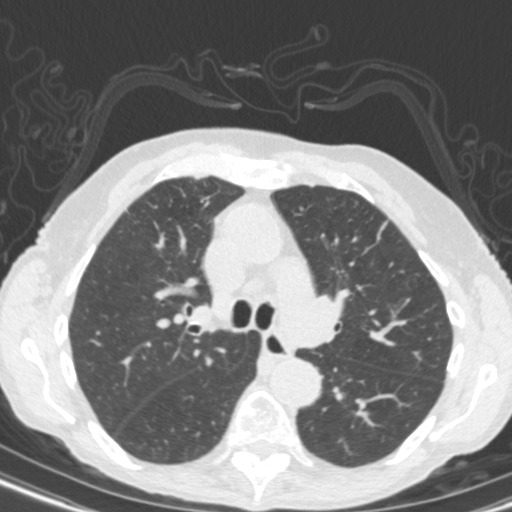
[im 255/346  lung]
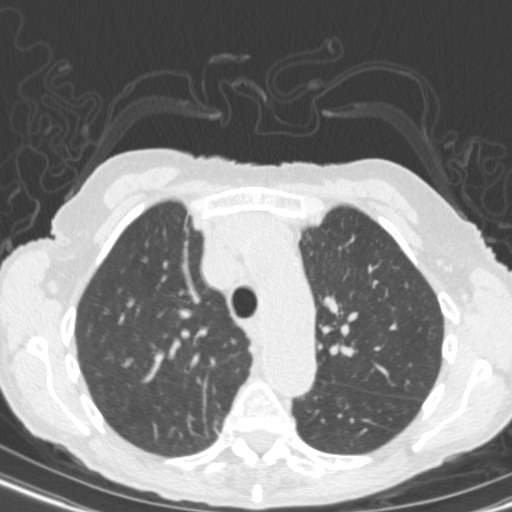
[im 273/346  mediastinal]
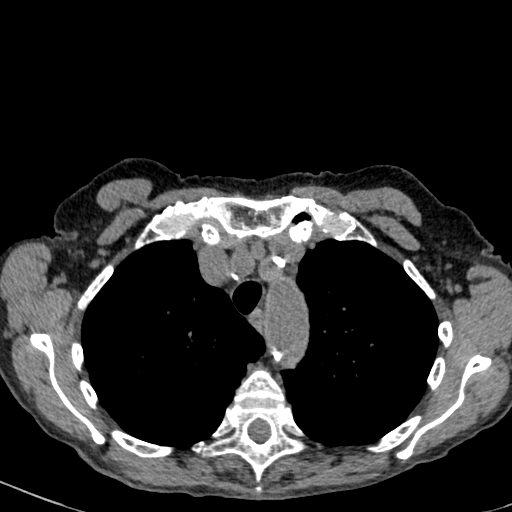
[im 273/346  lung]
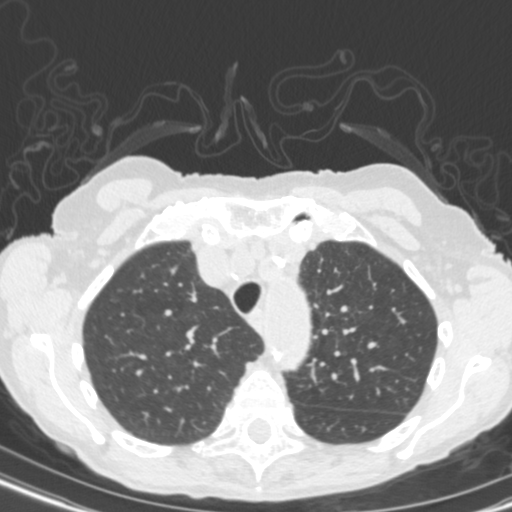
[im 291/346  lung]
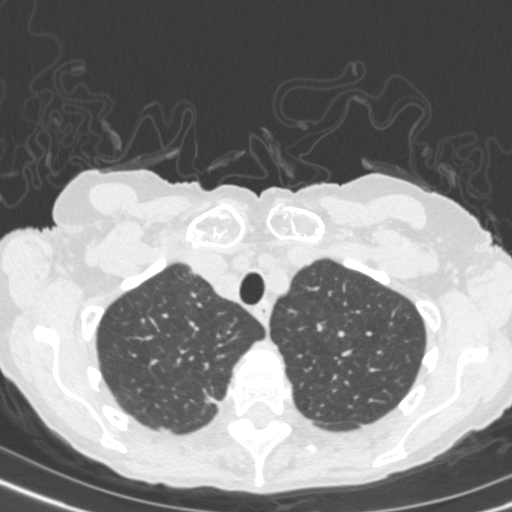
[im 327/346  lung]
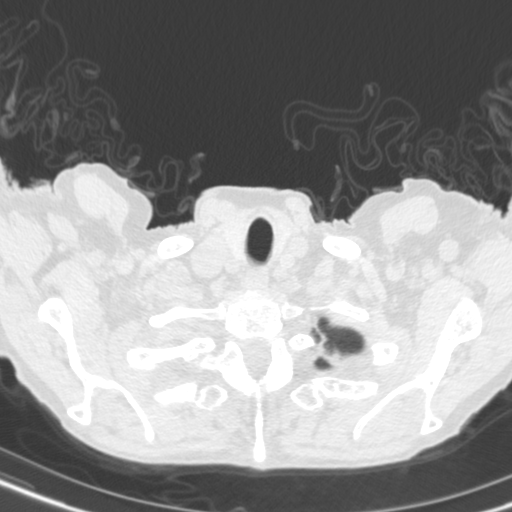

[15 of 32 positions shown; findings below may reference images not displayed]

FINDINGS: Cardiovascular: Atherosclerotic calcification of the aorta, aortic
valve and coronary arteries. Calcified ductus diverticulum off the
inferior aortic arch. Heart is enlarged. No pericardial effusion.

Mediastinum/Nodes: No pathologically enlarged mediastinal or
axillary lymph nodes. Hilar regions are difficult to definitively
evaluate without IV contrast. Esophagus is grossly unremarkable.

Lungs/Pleura: Mild biapical pleuroparenchymal scarring. Smoking
related respiratory bronchiolitis. Image quality is somewhat
degraded by respiratory motion. Scattered subsegmental volume loss.
No airspace consolidation or pleural fluid. Calcified left lower
lobe granuloma. No pleural fluid. Airway is unremarkable.

Upper Abdomen: Liver margin is slightly irregular. Visualized
portions of the liver, adrenal glands, kidneys, spleen and stomach
are otherwise unremarkable.

Musculoskeletal: Degenerative changes in the spine. No worrisome
lytic or sclerotic lesions.
IMPRESSION: 1. No acute findings to explain the patient's clinical history.
2. Liver margin is slightly irregular, indicative of cirrhosis.
3. Aortic atherosclerosis ([id number]). Coronary artery
calcification.

## 2023-05-11 ENCOUNTER — Other Ambulatory Visit (HOSPITAL_BASED_OUTPATIENT_CLINIC_OR_DEPARTMENT_OTHER): Payer: Self-pay

## 2023-05-11 MED ORDER — INFLUENZA VAC A&B SURF ANT ADJ 0.5 ML IM SUSY
0.5000 mL | PREFILLED_SYRINGE | Freq: Once | INTRAMUSCULAR | 0 refills | Status: AC
  Filled 2023-05-11: qty 0.5, 1d supply, fill #0

## 2023-05-11 MED ORDER — COVID-19 MRNA VAC-TRIS(PFIZER) 30 MCG/0.3ML IM SUSY
0.3000 mL | PREFILLED_SYRINGE | Freq: Once | INTRAMUSCULAR | 0 refills | Status: AC
  Filled 2023-05-11: qty 0.3, 1d supply, fill #0

## 2023-11-03 ENCOUNTER — Other Ambulatory Visit (HOSPITAL_BASED_OUTPATIENT_CLINIC_OR_DEPARTMENT_OTHER): Payer: Self-pay

## 2023-11-03 DIAGNOSIS — L089 Local infection of the skin and subcutaneous tissue, unspecified: Secondary | ICD-10-CM | POA: Diagnosis not present

## 2023-11-03 DIAGNOSIS — L989 Disorder of the skin and subcutaneous tissue, unspecified: Secondary | ICD-10-CM | POA: Diagnosis not present

## 2023-11-03 MED ORDER — DOXYCYCLINE HYCLATE 100 MG PO CAPS
100.0000 mg | ORAL_CAPSULE | Freq: Two times a day (BID) | ORAL | 0 refills | Status: AC
  Filled 2023-11-03: qty 14, 7d supply, fill #0

## 2023-11-03 MED ORDER — MUPIROCIN 2 % EX OINT
TOPICAL_OINTMENT | CUTANEOUS | 0 refills | Status: DC
  Filled 2023-11-03: qty 22, 20d supply, fill #0
  Filled 2023-11-03: qty 22, 5d supply, fill #0

## 2023-11-09 DIAGNOSIS — L089 Local infection of the skin and subcutaneous tissue, unspecified: Secondary | ICD-10-CM | POA: Diagnosis not present

## 2023-11-09 DIAGNOSIS — M79645 Pain in left finger(s): Secondary | ICD-10-CM | POA: Diagnosis not present

## 2023-12-02 ENCOUNTER — Other Ambulatory Visit (HOSPITAL_BASED_OUTPATIENT_CLINIC_OR_DEPARTMENT_OTHER): Payer: Self-pay

## 2023-12-02 DIAGNOSIS — L821 Other seborrheic keratosis: Secondary | ICD-10-CM | POA: Diagnosis not present

## 2023-12-02 DIAGNOSIS — D485 Neoplasm of uncertain behavior of skin: Secondary | ICD-10-CM | POA: Diagnosis not present

## 2023-12-02 DIAGNOSIS — L570 Actinic keratosis: Secondary | ICD-10-CM | POA: Diagnosis not present

## 2023-12-02 DIAGNOSIS — C44629 Squamous cell carcinoma of skin of left upper limb, including shoulder: Secondary | ICD-10-CM | POA: Diagnosis not present

## 2023-12-02 MED ORDER — MUPIROCIN 2 % EX OINT
TOPICAL_OINTMENT | CUTANEOUS | 0 refills | Status: AC
  Filled 2023-12-02: qty 22, 14d supply, fill #0

## 2024-01-04 ENCOUNTER — Other Ambulatory Visit: Payer: Self-pay

## 2024-01-04 DIAGNOSIS — Z720 Tobacco use: Secondary | ICD-10-CM

## 2024-01-05 ENCOUNTER — Other Ambulatory Visit: Payer: Self-pay

## 2024-01-05 DIAGNOSIS — Z720 Tobacco use: Secondary | ICD-10-CM

## 2024-02-09 DIAGNOSIS — E039 Hypothyroidism, unspecified: Secondary | ICD-10-CM | POA: Diagnosis not present

## 2024-02-09 DIAGNOSIS — D61818 Other pancytopenia: Secondary | ICD-10-CM | POA: Diagnosis not present

## 2024-02-09 DIAGNOSIS — Z0000 Encounter for general adult medical examination without abnormal findings: Secondary | ICD-10-CM | POA: Diagnosis not present

## 2024-02-09 DIAGNOSIS — R413 Other amnesia: Secondary | ICD-10-CM | POA: Diagnosis not present

## 2024-02-09 DIAGNOSIS — K7469 Other cirrhosis of liver: Secondary | ICD-10-CM | POA: Diagnosis not present

## 2024-02-16 DIAGNOSIS — I998 Other disorder of circulatory system: Secondary | ICD-10-CM | POA: Diagnosis not present

## 2024-02-16 DIAGNOSIS — R011 Cardiac murmur, unspecified: Secondary | ICD-10-CM | POA: Diagnosis not present

## 2024-02-16 DIAGNOSIS — E039 Hypothyroidism, unspecified: Secondary | ICD-10-CM | POA: Diagnosis not present

## 2024-02-16 DIAGNOSIS — Z720 Tobacco use: Secondary | ICD-10-CM | POA: Diagnosis not present

## 2024-02-16 DIAGNOSIS — K7469 Other cirrhosis of liver: Secondary | ICD-10-CM | POA: Diagnosis not present

## 2024-02-16 DIAGNOSIS — K219 Gastro-esophageal reflux disease without esophagitis: Secondary | ICD-10-CM | POA: Diagnosis not present

## 2024-02-16 DIAGNOSIS — Z0000 Encounter for general adult medical examination without abnormal findings: Secondary | ICD-10-CM | POA: Diagnosis not present

## 2024-02-16 DIAGNOSIS — R413 Other amnesia: Secondary | ICD-10-CM | POA: Diagnosis not present

## 2024-02-16 DIAGNOSIS — D61818 Other pancytopenia: Secondary | ICD-10-CM | POA: Diagnosis not present

## 2024-02-16 DIAGNOSIS — F418 Other specified anxiety disorders: Secondary | ICD-10-CM | POA: Diagnosis not present

## 2024-05-17 ENCOUNTER — Other Ambulatory Visit (HOSPITAL_BASED_OUTPATIENT_CLINIC_OR_DEPARTMENT_OTHER): Payer: Self-pay

## 2024-05-17 MED ORDER — COMIRNATY 30 MCG/0.3ML IM SUSY
0.3000 mL | PREFILLED_SYRINGE | Freq: Once | INTRAMUSCULAR | 0 refills | Status: AC
  Filled 2024-05-17: qty 0.3, 1d supply, fill #0

## 2024-05-17 MED ORDER — FLUZONE HIGH-DOSE 0.5 ML IM SUSY
0.5000 mL | PREFILLED_SYRINGE | Freq: Once | INTRAMUSCULAR | 0 refills | Status: AC
  Filled 2024-05-17: qty 0.5, 1d supply, fill #0
